# Patient Record
Sex: Male | Born: 1970 | Race: White | Hispanic: No | Marital: Married | Smoking: Never smoker
Health system: Southern US, Community
[De-identification: ages and names within clinical notes are randomized; demographics above are authoritative.]

## PROBLEM LIST (undated history)

## (undated) DIAGNOSIS — F419 Anxiety disorder, unspecified: Secondary | ICD-10-CM

---

## 2007-08-29 ENCOUNTER — Ambulatory Visit: Payer: Self-pay | Admitting: Family Medicine

## 2008-07-26 ENCOUNTER — Ambulatory Visit: Payer: Self-pay | Admitting: Internal Medicine

## 2012-03-13 ENCOUNTER — Ambulatory Visit: Payer: Self-pay | Admitting: Internal Medicine

## 2016-03-23 ENCOUNTER — Encounter: Payer: Self-pay | Admitting: *Deleted

## 2016-03-23 ENCOUNTER — Emergency Department: Payer: Worker's Compensation

## 2016-03-23 ENCOUNTER — Emergency Department
Admission: EM | Admit: 2016-03-23 | Discharge: 2016-03-23 | Disposition: A | Discharge status: Home / Self Care | Payer: Worker's Compensation | Attending: Student | Admitting: Student

## 2016-03-23 DIAGNOSIS — Y999 Unspecified external cause status: Secondary | ICD-10-CM | POA: Diagnosis not present

## 2016-03-23 DIAGNOSIS — Y9389 Activity, other specified: Secondary | ICD-10-CM | POA: Insufficient documentation

## 2016-03-23 DIAGNOSIS — W228XXA Striking against or struck by other objects, initial encounter: Secondary | ICD-10-CM | POA: Insufficient documentation

## 2016-03-23 DIAGNOSIS — Y929 Unspecified place or not applicable: Secondary | ICD-10-CM | POA: Insufficient documentation

## 2016-03-23 DIAGNOSIS — S199XXA Unspecified injury of neck, initial encounter: Secondary | ICD-10-CM

## 2016-03-23 DIAGNOSIS — S161XXA Strain of muscle, fascia and tendon at neck level, initial encounter: Secondary | ICD-10-CM | POA: Diagnosis not present

## 2016-03-23 DIAGNOSIS — R51 Headache: Secondary | ICD-10-CM | POA: Insufficient documentation

## 2016-03-23 HISTORY — DX: Anxiety disorder, unspecified: F41.9

## 2016-03-23 MED ORDER — PREDNISONE 10 MG (21) PO TBPK: ORAL_TABLET | ORAL | 0 refills | Status: AC

## 2016-03-23 MED ORDER — HYDROCODONE-ACETAMINOPHEN 5-325 MG PO TABS: 1.0000 | ORAL_TABLET | ORAL | 0 refills | Status: AC | PRN

## 2016-03-23 MED ORDER — NAPROXEN 500 MG PO TABS: 500.0000 mg | ORAL_TABLET | Freq: Two times a day (BID) | ORAL | 0 refills | Status: AC

## 2016-03-23 NOTE — Discharge Instructions (Signed)
Follow up with the PCP of your choice for symptoms that are not improving over the next few days.\ Return to the ER immediately if you feel like your throat is closing or if you can't swallow.

## 2016-03-23 NOTE — ED Notes (Signed)
Patient transported to CT 

## 2016-03-23 NOTE — ED Provider Notes (Signed)
[facility] Emergency Department Provider Note ____________________________________________  Time seen: Approximately 5:01 PM  I have reviewed the triage vital signs and the nursing notes.   HISTORY  Chief Complaint Neck Pain and Migraine   HPI Tracy Frazier is a 45 y.o. male presents to the emergency department for evaluation after being injured at work. A large cable that is attached to the ceiling was plugged in and got caught on some type of machine which caused it to have an excess amount of tension and "snapped" and wrapped aroundhis neck. He states that a round ball that is attached to the cable struck him in the right side of his neck just the side of his trachea. He states that he feels as if it is a little swollen and has some dysphasia, but does not have any trouble breathing. He states that when the cord wrapped around his neck and jerked, he heard a pop and immediately felt pain. He states that now he has a headache due to the stress of the situation and the pain in his neck. He arrives to the emergency department with a rigid collar in place.   Past Medical History:  Diagnosis Date  . Anxiety     There are no active problems to display for this patient.   History reviewed. No pertinent surgical history.  Prior to Admission medications   Medication Sig Start Date End Date Taking? Authorizing Provider  HYDROcodone-acetaminophen (NORCO/VICODIN) 5-325 MG tablet Take 1 tablet by mouth every 4 (four) hours as needed for moderate pain. 03/23/16 03/23/17  Chinita Pester, FNP  naproxen (NAPROSYN) 500 MG tablet Take 1 tablet (500 mg total) by mouth 2 (two) times daily with a meal. 03/23/16   Sharmeka Palmisano B Adamary Savary, FNP  predniSONE (STERAPRED UNI-PAK 21 TAB) 10 MG (21) TBPK tablet Take 6 tablets on day 1 Take 5 tablets on day 2 Take 4 tablets on day 3 Take 3 tablets on day 4 Take 2 tablets on day 5 Take 1 tablet on day 6 03/23/16   Chinita Pester, FNP     Allergies Review of patient's allergies indicates not on file.  History reviewed. No pertinent family history.  Social History Social History  Substance Use Topics  . Smoking status: Never Smoker  . Smokeless tobacco: Never Used  . Alcohol use No    Review of Systems Constitutional: No fever/chills or recent injury. Eyes: No visual changes. ENT: No sore throat. Respiratory: Denies shortness of breath. Gastrointestinal: No abdominal pain.  No nausea, no vomiting.  No diarrhea.  No constipation. Musculoskeletal: Negative for pain. Skin: Negative for rash. Neurological:Positive for headache, Negative for focal weakness or numbness. No confusion or fainting. ___________________________________________   PHYSICAL EXAM:  VITAL SIGNS: ED Triage Vitals [03/23/16 1653]  Enc Vitals Group     BP 122/69     Pulse Rate 64     Resp 16     Temp 98.2 F (36.8 C)     Temp Source Oral     SpO2 95 %     Weight 175 lb (79.4 kg)     Height 5\' 10"  (1.778 m)     Head Circumference      Peak Flow      Pain Score 5     Pain Loc      Pain Edu?      Excl. in GC?     Constitutional: Alert and oriented. Well appearing and in no acute distress. Eyes: Conjunctivae  are normal. PERRL. EOMI. No evidence of papilledema on limited exam. Head: Atraumatic. Nose: No congestion/rhinnorhea. Mouth/Throat: Mucous membranes are moist.  Oropharynx non-erythematous. Neck: No stridor. No meningismus.   Cardiovascular: Normal rate, regular rhythm. Grossly normal heart sounds.  Good peripheral circulation. Respiratory: Normal respiratory effort.  No retractions. Lungs CTAB. Musculoskeletal: No lower extremity tenderness nor edema.  No joint effusions. C-collar not removed on initial exam due to mechanism of injury. After CT results were obtained, collar was removed and there was no midline tenderness of the cervical spine on palpation. There is paraspinal tenderness associated with turning the head left  and right. Neurologic:  Normal speech and language. No gross focal neurologic deficits are appreciated. No gait instability. Cranial nerves: 2-10 normal as tested. Skin:  Skin is warm, dry and intact. No rash noted. Psychiatric: Mood and affect are normal. Speech and behavior are normal. Normal thought process and cognition.  ____________________________________________   LABS (all labs ordered are listed, but only abnormal results are displayed)  Labs Reviewed - No data to display ____________________________________________  EKG   ____________________________________________  RADIOLOGY  CT head and cervical spine are negative for acute normality per radiology. ____________________________________________   PROCEDURES  Procedure(s) performed: None  Critical Care performed: No  ____________________________________________   INITIAL IMPRESSION / ASSESSMENT AND PLAN / ED COURSE  Pertinent labs & imaging results that were available during my care of the patient were reviewed by me and considered in my medical decision making (see chart for details).  Prescriptions for prednisone, Norco, and Naprosyn will be prescribed today. Prednisone given in prevention of additional soft tissue swelling of the anterior neck. Patient was advised to follow up with the primary care provider for symptoms that are not relieved or improved over the next 24 hours. Also advised to return to the emergency department for symptoms that change or worsen, specifically if he feels that his airway is closing or he is no longer able to swallow if unable to schedule an appointment. ____________________________________________   FINAL CLINICAL IMPRESSION(S) / ED DIAGNOSES  Final diagnoses:  Cervical strain, acute, initial encounter  Soft tissue injury of neck, initial encounter      Brieonna Crutcher B Chere Babson, FNP 0Chinita Pester8/25/17 1922    Gayla DossEryka A Gayle, MD 03/24/16 [phone]

## 2016-03-23 NOTE — ED Triage Notes (Signed)
Pt arrived to ED from work after a cord from work "snapped" and hit pt in the neck. Pt reports cord has a knot on the end that hit his head and neck up and to the left. Pt verbalized hearing a "pop" in his neck after being hit. Pt denies LOC. Pt alert and oriented upon arrival and verbalizing neck pain and headache. C collar in place upon arrival. Pt ambulatory from EMS stretcher.

## 2018-04-11 IMAGING — CT CT HEAD W/O CM
5 of 7 series · 17 of 47 positions shown, 18 images · non-contrast
Comparison: None.

CLINICAL DATA: Pt arrived to ED from work after a cord from work
"snapped" and hit pt in the neck. Pt reports cord has a knot on the
end that hit his head and neck up and to the left. Pt verbalized
hearing a "pop" in his neck after being hit.

EXAM:
CT HEAD WITHOUT CONTRAST
CT CERVICAL SPINE WITHOUT CONTRAST
TECHNIQUE: Multidetector CT imaging of the head and cervical spine was
performed following the standard protocol without intravenous
contrast. Multiplanar CT image reconstructions of the cervical spine
were also generated.

[Series 2: head wo · axial · 0.44mm/px · z∈[-64,-14]mm · 2 of 31 slices shown, 3 images]
[im 11/31  brain]
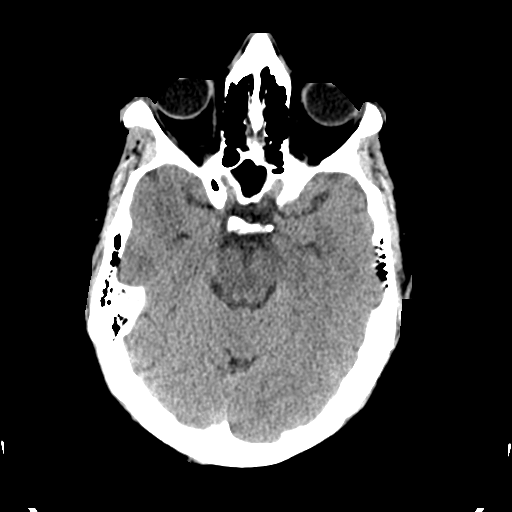
[im 11/31  bone]
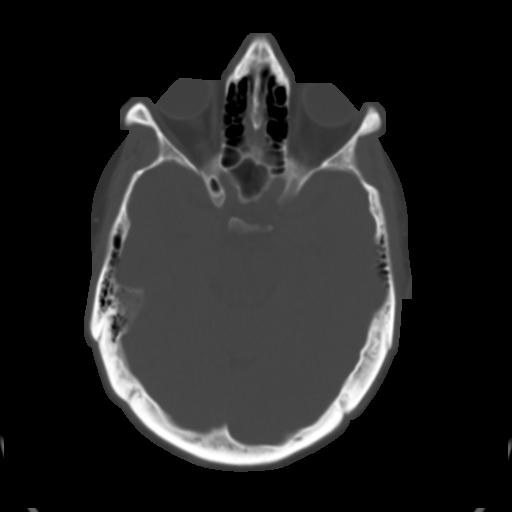
[im 21/31  brain]
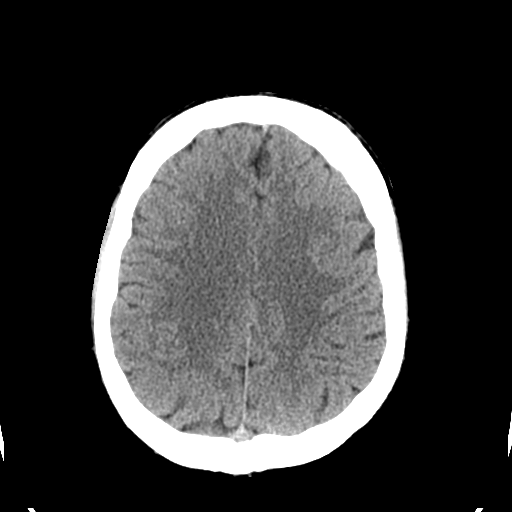

[Series 4: coronal soft tissue · coronal · 0.30mm/px · 3 of 67 slices shown]
[im 10/67  brain]
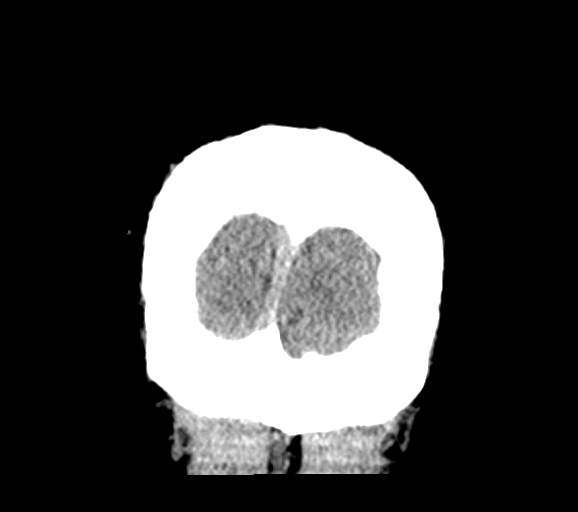
[im 20/67  brain]
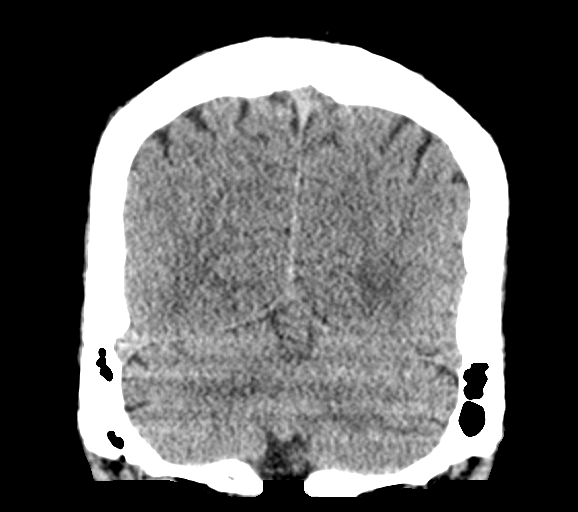
[im 30/67  brain]
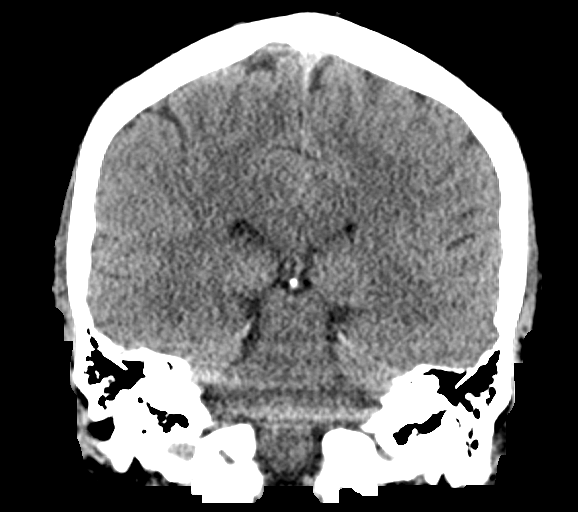

[Series 5: sagittal soft tissue · sagittal · 0.30mm/px · 2 of 53 slices shown]
[im 18/53  brain]
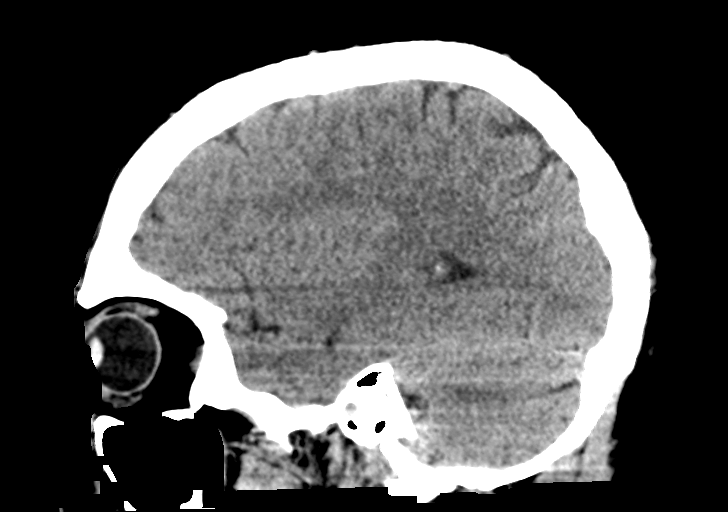
[im 35/53  brain]
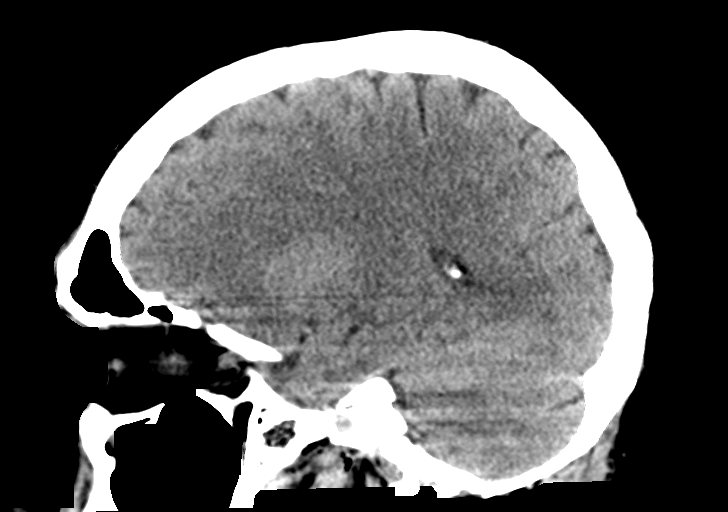

[Series 7: c spine soft · axial · 0.30mm/px · z∈[-272,-240]mm · 2 of 96 slices shown]
[im 8/96  brain]
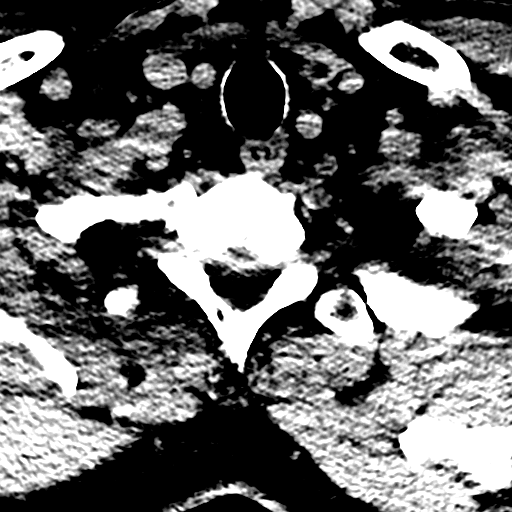
[im 24/96  brain]
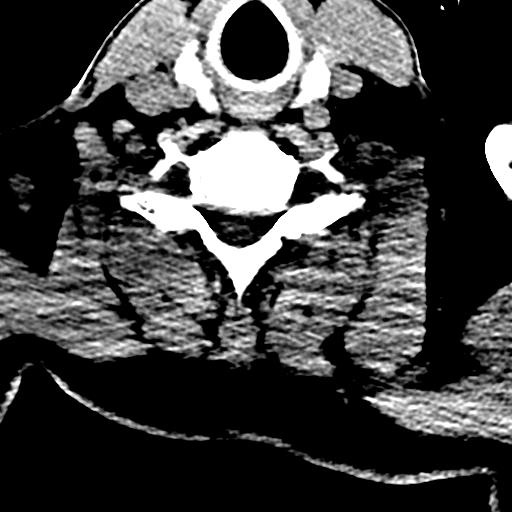

[Series 10: orthogonal bone · axial · 0.23mm/px · z∈[-286,-134]mm · 8 of 97 slices shown]
[im 9/97  bone]
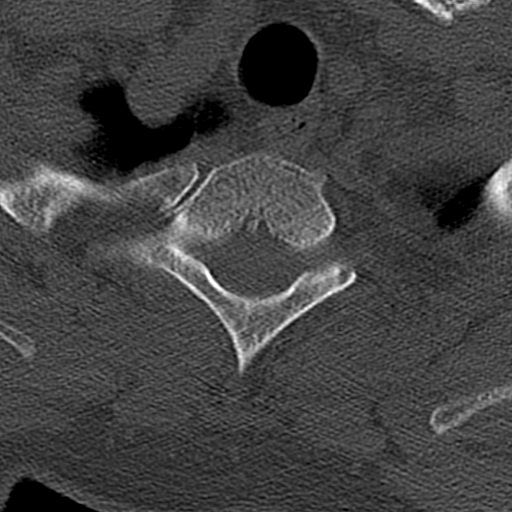
[im 25/97  bone]
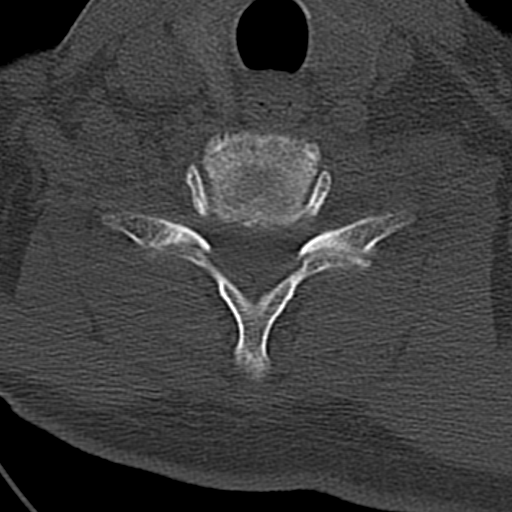
[im 33/97  bone]
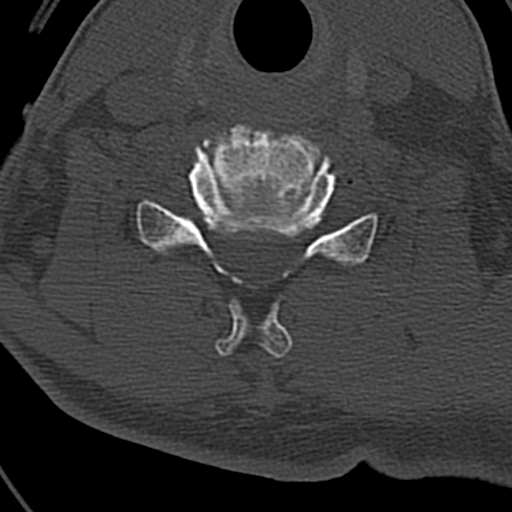
[im 41/97  bone]
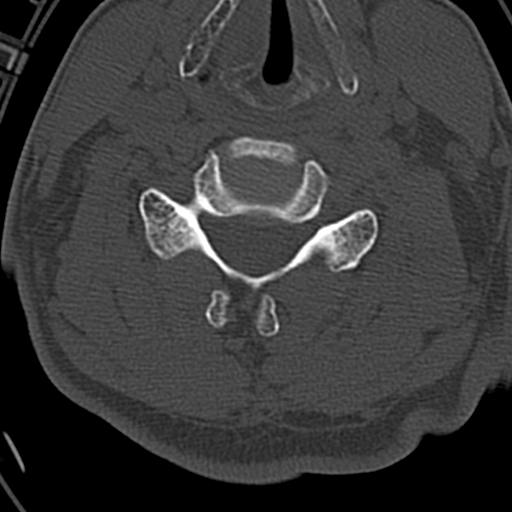
[im 57/97  bone]
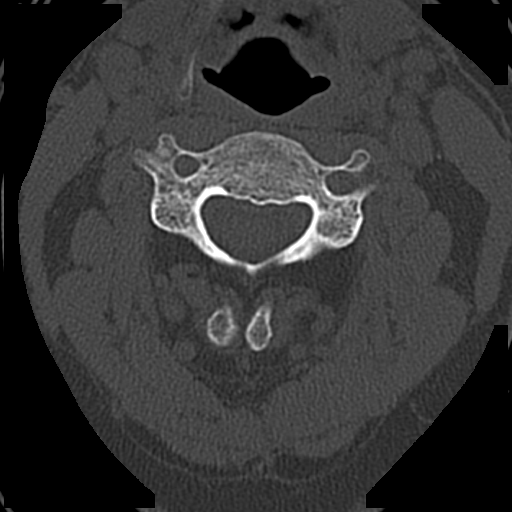
[im 65/97  bone]
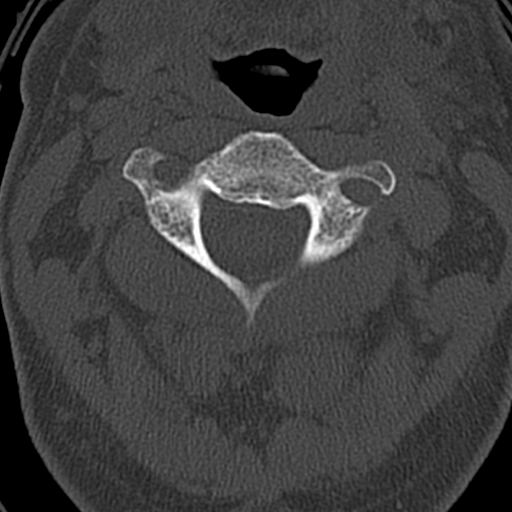
[im 73/97  bone]
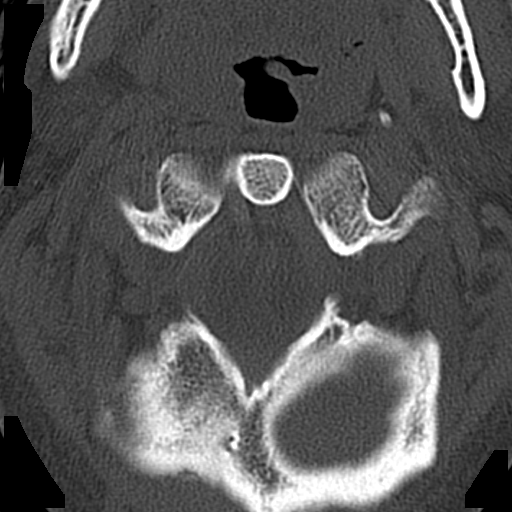
[im 89/97  bone]
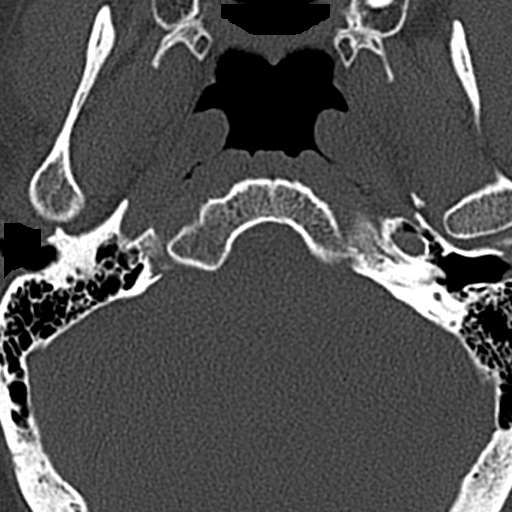

[17 of 47 positions shown; findings below may reference images not displayed]

FINDINGS: CT HEAD FINDINGS

No intracranial hemorrhage. No parenchymal contusion. No midline
shift or mass effect. Basilar cisterns are patent. No skull base
fracture. No fluid in the paranasal sinuses or mastoid air cells.
Orbits are normal.

CT CERVICAL SPINE FINDINGS

No prevertebral soft tissue swelling. Normal alignment of cervical
vertebral bodies. No loss of vertebral body height. Normal facet
articulation. Normal craniocervical junction.Degenerate spurring at
C5-C6No evidence epidural or paraspinal hematoma.
IMPRESSION: 1. No intracranial trauma.
2. No cervical spine fracture
